# Patient Record
Sex: Male | Born: 1960 | Race: White | Hispanic: No | Marital: Single | Smoking: Former smoker
Health system: Southern US, Community
[De-identification: ages and names within clinical notes are randomized; demographics above are authoritative.]

## PROBLEM LIST (undated history)

## (undated) DIAGNOSIS — C801 Malignant (primary) neoplasm, unspecified: Secondary | ICD-10-CM

---

## 2003-11-17 ENCOUNTER — Encounter: Admission: RE | Admit: 2003-11-17 | Discharge: 2003-11-30 | Payer: Self-pay | Admitting: Occupational Medicine

## 2011-08-31 ENCOUNTER — Encounter: Payer: Self-pay | Admitting: *Deleted

## 2011-08-31 ENCOUNTER — Emergency Department (HOSPITAL_COMMUNITY)
Admission: EM | Admit: 2011-08-31 | Discharge: 2011-08-31 | Disposition: A | Discharge status: Home / Self Care | Payer: Self-pay | Attending: Emergency Medicine | Admitting: Emergency Medicine

## 2011-08-31 DIAGNOSIS — L03116 Cellulitis of left lower limb: Secondary | ICD-10-CM

## 2011-08-31 DIAGNOSIS — L02419 Cutaneous abscess of limb, unspecified: Secondary | ICD-10-CM | POA: Insufficient documentation

## 2011-08-31 DIAGNOSIS — M79609 Pain in unspecified limb: Secondary | ICD-10-CM | POA: Insufficient documentation

## 2011-08-31 DIAGNOSIS — Y92009 Unspecified place in unspecified non-institutional (private) residence as the place of occurrence of the external cause: Secondary | ICD-10-CM | POA: Insufficient documentation

## 2011-08-31 DIAGNOSIS — M7989 Other specified soft tissue disorders: Secondary | ICD-10-CM | POA: Insufficient documentation

## 2011-08-31 DIAGNOSIS — S90569A Insect bite (nonvenomous), unspecified ankle, initial encounter: Secondary | ICD-10-CM | POA: Insufficient documentation

## 2011-08-31 MED ORDER — DOXYCYCLINE HYCLATE 100 MG PO TABS
100.0000 mg | ORAL_TABLET | Freq: Once | ORAL | Status: AC
  Administered 2011-08-31: 100 mg via ORAL
  Filled 2011-08-31: qty 1

## 2011-08-31 MED ORDER — HYDROCODONE-ACETAMINOPHEN 5-325 MG PO TABS: 1.0000 | ORAL_TABLET | ORAL | Status: AC | PRN

## 2011-08-31 MED ORDER — HYDROCODONE-ACETAMINOPHEN 5-325 MG PO TABS
1.0000 | ORAL_TABLET | Freq: Once | ORAL | Status: AC
  Administered 2011-08-31: 1 via ORAL
  Filled 2011-08-31: qty 1

## 2011-08-31 MED ORDER — DOXYCYCLINE HYCLATE 100 MG PO CAPS: 100.0000 mg | ORAL_CAPSULE | Freq: Two times a day (BID) | ORAL | Status: AC

## 2011-08-31 NOTE — ED Notes (Signed)
Patient states that he saw a bite on his left knee yesterday and popped it and today is got bigger and very painful

## 2011-08-31 NOTE — ED Provider Notes (Signed)
History     CSN: [id number] Arrival date & time: 08/31/2011  6:02 PM   First MD Initiated Contact with Kenneth Chambers 08/31/11 1812      Chief Complaint  Kenneth Chambers presents with  . Insect Bite    (Consider location/radiation/quality/duration/timing/severity/associated sxs/prior treatment) HPI Comments: Kenneth Chambers noted a small pimple in his left distal anterior thigh 3 days ago.  He attempted to open it without significant success and since that time has had increased redness, swelling and pain at the site.  Minimal drainage at the site.  No fevers.  He's had one prior site of an abscess on the thigh previously.  He's tried ibuprofen at home for his symptoms but they have not helped his pain.  Kenneth Chambers is a 50 y.o. male presenting with leg pain. The history is provided by the Kenneth Chambers. No language interpreter was used.  Leg Pain  The incident occurred more than 2 days ago. The incident occurred at home. There was no injury mechanism. The pain is present in the left thigh. The quality of the pain is described as aching. The pain is moderate. The pain has been constant since onset. Pertinent negatives include no numbness, no inability to bear weight, no loss of motion, no muscle weakness, no loss of sensation and no tingling.    History reviewed. No pertinent past medical history.  History reviewed. No pertinent past surgical history.  History reviewed. No pertinent family history.  History  Substance Use Topics  . Smoking status: Former Smoker    Types: Cigarettes  . Smokeless tobacco: Not on file  . Alcohol Use: No      Review of Systems  Constitutional: Negative.  Negative for fever and chills.  HENT: Negative.   Eyes: Negative.  Negative for discharge and redness.  Respiratory: Negative.  Negative for cough and shortness of breath.   Cardiovascular: Negative.  Negative for chest pain.  Gastrointestinal: Negative.  Negative for nausea, vomiting and abdominal pain.  Genitourinary:  Negative.  Negative for hematuria.  Musculoskeletal: Negative.  Negative for back pain.  Skin: Positive for color change. Negative for rash.  Neurological: Negative for tingling, syncope, numbness and headaches.  Hematological: Negative.  Negative for adenopathy.  Psychiatric/Behavioral: Negative.  Negative for confusion.  All other systems reviewed and are negative.    Allergies  Review of Kenneth Chambers's allergies indicates no known allergies.  Home Medications  No current outpatient prescriptions on file.  BP 110/72  Pulse 94  Temp(Src) 97.4 F (36.3 C) (Oral)  Resp 16  SpO2 98%  Physical Exam  Constitutional: He is oriented to person, place, and time. He appears well-developed and well-nourished.  HENT:  Head: Normocephalic and atraumatic.  Eyes: Conjunctivae and EOM are normal. Pupils are equal, round, and reactive to light.  Neck: Normal range of motion. Neck supple.  Pulmonary/Chest: Effort normal.  Musculoskeletal: Normal range of motion.  Neurological: He is alert and oriented to person, place, and time.  Skin: Skin is warm and dry. No rash noted. There is erythema. No pallor.       Left distal anterior thigh has an area of approximately 3 inches in diameter of erythema and induration.  No fluctuance or crepitus noted.  There is a central 1 cm area of scab.  Psychiatric: He has a normal mood and affect. His behavior is normal. Judgment and thought content normal.    ED Course  Procedures (including critical care time)  Labs Reviewed - No data to display No results found.  No diagnosis found.    MDM  Kenneth Chambers presents with cellulitis to his left upper knee.  On ultrasound I visualized only minimal fluid pockets.  I did attempt a needle aspiration for incision and drainage without significant success for any pus returned.  Kenneth Chambers will be placed on doxycycline and Vicodin for pain control with followup with his primary care physician next week.  He understands to  return for worsening redness or swelling.        Nat Christen, MD 08/31/11 [phone]

## 2011-08-31 NOTE — ED Notes (Signed)
Secondary: lt. Upper, ant. Thigh insect bite. Onset: past Saturday. Reddened area ~4" x 4" diamter. Mild drainage, whitish/yellow.

## 2018-11-19 ENCOUNTER — Emergency Department (HOSPITAL_COMMUNITY)
Admission: EM | Admit: 2018-11-19 | Discharge: 2018-11-19 | Disposition: A | Discharge status: Home / Self Care | Payer: Medicaid Other | Attending: Emergency Medicine | Admitting: Emergency Medicine

## 2018-11-19 ENCOUNTER — Emergency Department (HOSPITAL_COMMUNITY): Payer: Medicaid Other

## 2018-11-19 ENCOUNTER — Encounter (HOSPITAL_COMMUNITY): Payer: Self-pay

## 2018-11-19 ENCOUNTER — Other Ambulatory Visit: Payer: Self-pay

## 2018-11-19 DIAGNOSIS — K9189 Other postprocedural complications and disorders of digestive system: Secondary | ICD-10-CM | POA: Diagnosis present

## 2018-11-19 DIAGNOSIS — Z87891 Personal history of nicotine dependence: Secondary | ICD-10-CM | POA: Insufficient documentation

## 2018-11-19 DIAGNOSIS — Z79899 Other long term (current) drug therapy: Secondary | ICD-10-CM | POA: Diagnosis not present

## 2018-11-19 HISTORY — DX: Malignant (primary) neoplasm, unspecified: C80.1

## 2018-11-19 LAB — COMPREHENSIVE METABOLIC PANEL
ALK PHOS: 201 U/L — AB (ref 38–126)
ALT: 30 U/L (ref 0–44)
ANION GAP: 7 (ref 5–15)
AST: 59 U/L — AB (ref 15–41)
Albumin: 2.3 g/dL — ABNORMAL LOW (ref 3.5–5.0)
BUN: 11 mg/dL (ref 6–20)
CO2: 25 mmol/L (ref 22–32)
Calcium: 8.2 mg/dL — ABNORMAL LOW (ref 8.9–10.3)
Chloride: 103 mmol/L (ref 98–111)
Creatinine, Ser: 0.49 mg/dL — ABNORMAL LOW (ref 0.61–1.24)
GFR calc Af Amer: >60 mL/min (ref >60)
Glucose, Bld: 109 mg/dL — ABNORMAL HIGH (ref 70–99)
POTASSIUM: 3.9 mmol/L (ref 3.5–5.1)
SODIUM: 135 mmol/L (ref 135–145)
TOTAL PROTEIN: 7.2 g/dL (ref 6.5–8.1)
Total Bilirubin: 7.4 mg/dL — ABNORMAL HIGH (ref 0.3–1.2)

## 2018-11-19 LAB — PROTIME-INR
INR: 1.42
PROTHROMBIN TIME: 17.1 s — AB (ref 11.4–15.2)

## 2018-11-19 LAB — CBC WITH DIFFERENTIAL/PLATELET
Abs Immature Granulocytes: 0.35 10*3/uL — ABNORMAL HIGH (ref 0.00–0.07)
Basophils Absolute: 0.1 10*3/uL (ref 0.0–0.1)
Basophils Relative: 1 %
Eosinophils Absolute: 0.5 10*3/uL (ref 0.0–0.5)
Eosinophils Relative: 3 %
HCT: 30.2 % — ABNORMAL LOW (ref 39.0–52.0)
HEMOGLOBIN: 9.4 g/dL — AB (ref 13.0–17.0)
IMMATURE GRANULOCYTES: 2 %
LYMPHS PCT: 10 %
Lymphs Abs: 1.9 10*3/uL (ref 0.7–4.0)
MCH: 31.3 pg (ref 26.0–34.0)
MCHC: 31.1 g/dL (ref 30.0–36.0)
MCV: 100.7 fL — ABNORMAL HIGH (ref 80.0–100.0)
MONO ABS: 1.9 10*3/uL — AB (ref 0.1–1.0)
MONOS PCT: 10 %
NEUTROS ABS: 14 10*3/uL — AB (ref 1.7–7.7)
NEUTROS PCT: 74 %
Platelets: 313 10*3/uL (ref 150–400)
RBC: 3 MIL/uL — AB (ref 4.22–5.81)
RDW: 17.5 % — ABNORMAL HIGH (ref 11.5–15.5)
WBC: 18.7 10*3/uL — AB (ref 4.0–10.5)
nRBC: 0 % (ref 0.0–0.2)

## 2018-11-19 LAB — LIPASE, BLOOD: LIPASE: 26 U/L (ref 11–51)

## 2018-11-19 LAB — I-STAT CREATININE, ED: Creatinine, Ser: 0.5 mg/dL — ABNORMAL LOW (ref 0.61–1.24)

## 2018-11-19 MED ORDER — IOPAMIDOL (ISOVUE-300) INJECTION 61%
100.0000 mL | Freq: Once | INTRAVENOUS | Status: AC | PRN
  Administered 2018-11-19: 100 mL via INTRAVENOUS

## 2018-11-19 MED ORDER — ONDANSETRON HCL 4 MG/2ML IJ SOLN
4.0000 mg | Freq: Once | INTRAMUSCULAR | Status: AC
Start: 2018-11-19 — End: 2018-11-19
  Administered 2018-11-19: 4 mg via INTRAVENOUS
  Filled 2018-11-19: qty 2

## 2018-11-19 MED ORDER — IOPAMIDOL (ISOVUE-300) INJECTION 61%
INTRAVENOUS | Status: AC
  Filled 2018-11-19: qty 100

## 2018-11-19 MED ORDER — SODIUM CHLORIDE (PF) 0.9 % IJ SOLN
INTRAMUSCULAR | Status: AC
  Filled 2018-11-19: qty 50

## 2018-11-19 MED ORDER — HYDROMORPHONE HCL 1 MG/ML IJ SOLN
1.0000 mg | Freq: Once | INTRAMUSCULAR | Status: AC
  Administered 2018-11-19: 1 mg via INTRAVENOUS
  Filled 2018-11-19: qty 1

## 2018-11-19 MED ORDER — MORPHINE SULFATE (PF) 4 MG/ML IV SOLN
4.0000 mg | Freq: Once | INTRAVENOUS | Status: AC
  Administered 2018-11-19: 4 mg via INTRAVENOUS
  Filled 2018-11-19: qty 1

## 2018-11-19 MED ORDER — SODIUM CHLORIDE 0.9 % IV BOLUS
1000.0000 mL | Freq: Once | INTRAVENOUS | Status: AC
  Administered 2018-11-19: 1000 mL via INTRAVENOUS

## 2018-11-19 NOTE — Discharge Instructions (Signed)
Follow up with IR.  Return for worsening issues.  Try the new dressing.

## 2018-11-19 NOTE — ED Triage Notes (Signed)
Pt reports that he had a stent placed in bile duct with a drain last Wednesday. Pt reports that it started leaking about a 1 1/2 days ago which is also causing severe pain.

## 2018-11-19 NOTE — ED Provider Notes (Signed)
[facility] DEPT Provider Note   CSN: [id number] Arrival date & time: 11/19/18  1641     History   Chief Complaint Chief Complaint  Patient presents with  . Post-op Problem  . Cancer Patient    HPI Kenneth Chambers is a 58 y.o. male.  58 yo M with a cc of draining from a biliary drain site. Patient seen recently at 45 with acute cholangitis s/p ERCP and biliary drain placement.   The history is provided by the patient.  Illness  Severity:  Mild Onset quality:  Sudden Duration:  2 days Timing:  Constant Progression:  Worsening Chronicity:  New Associated symptoms: abdominal pain   Associated symptoms: no chest pain, no congestion, no diarrhea, no fever, no headaches, no myalgias, no rash, no shortness of breath and no vomiting     Past Medical History:  Diagnosis Date  . Cancer ([place])     There are no active problems to display for this patient.   History reviewed. No pertinent surgical history.      Home Medications    Prior to Admission medications   Medication Sig Start Date End Date Taking? Authorizing Provider  amLODipine (NORVASC) 10 MG tablet Take 10 mg by mouth daily.   Yes [provider]  ciprofloxacin (CIPRO) 500 MG tablet Take 500 mg by mouth 2 (two) times daily.   Yes [provider]  finasteride (PROSCAR) 5 MG tablet Take 5 mg by mouth daily.   Yes [provider]  gabapentin (NEURONTIN) 300 MG capsule Take 300 mg by mouth 3 (three) times daily.   Yes [provider]  HYDROmorphone (DILAUDID) 4 MG tablet Take 4 mg by mouth every 3 (three) hours as needed for severe pain.   Yes [provider]  lactulose (CHRONULAC) 10 GM/15ML solution Take 20 g by mouth daily.   Yes [provider]  magnesium oxide (MAG-OX) 400 MG tablet Take 400 mg by mouth daily.   Yes [provider]  Melatonin 3 MG TABS Take 6 mg by mouth at bedtime as needed (sleep).   Yes [provider]  metoprolol succinate (TOPROL-XL) 25 MG 24 hr tablet Take 25 mg by mouth daily.   Yes [provider]  metroNIDAZOLE (FLAGYL) 500 MG tablet Take 500 mg by mouth 3 (three) times daily.   Yes [provider]  nystatin (NYSTATIN) powder Apply topically 2 (two) times daily. 1 application topically twice daily.   Yes [provider]  omeprazole (PRILOSEC) 40 MG capsule Take 40 mg by mouth daily.   Yes [provider]  oxycodone (ROXICODONE) 30 MG immediate release tablet Take 60 mg by mouth every 8 (eight) hours.   Yes [provider]  polyethylene glycol (MIRALAX / GLYCOLAX) packet Take 17 g by mouth daily.   Yes [provider]  prochlorperazine (COMPAZINE) 5 MG tablet Take 5 mg by mouth every 6 (six) hours as needed for nausea or vomiting.   Yes [provider]  sennosides-docusate sodium (SENOKOT-S) 8.6-50 MG tablet Take 2 tablets by mouth 2 (two) times daily.   Yes [provider]  sertraline (ZOLOFT) 100 MG tablet Take 200 mg by mouth daily.   Yes [provider]  spironolactone (ALDACTONE) 25 MG tablet Take 25 mg by mouth 2 (two) times daily.   Yes [provider]  VITAMIN D, CHOLECALCIFEROL, PO Take 800 Units by mouth daily.   Yes [provider]    Family History History  reviewed. No pertinent family history.  Social History Social History   Tobacco Use  . Smoking status: Former Smoker    Types: Cigarettes  Substance Use Topics  . Alcohol use: No  . Drug use: No     Allergies   Patient has no known allergies.   Review of Systems Review of Systems  Constitutional: Negative for chills and fever.  HENT: Negative for congestion and facial swelling.   Eyes: Negative for discharge and visual disturbance.  Respiratory: Negative for shortness of breath.   Cardiovascular: Negative for chest pain and palpitations.  Gastrointestinal: Positive for abdominal pain. Negative  for diarrhea and vomiting.  Musculoskeletal: Negative for arthralgias and myalgias.  Skin: Negative for color change and rash.  Neurological: Negative for tremors, syncope and headaches.  Psychiatric/Behavioral: Negative for confusion and dysphoric mood.     Physical Exam Updated Vital Signs BP 124/85   Pulse (!) 101   Temp 98.6 F (37 C) (Oral)   Resp 16   Ht 5\' 6"  (1.676 m)   Wt 63 kg   SpO2 95%   BMI 22.44 kg/m   Physical Exam Vitals signs and nursing note reviewed.  Constitutional:      Appearance: He is well-developed.     Comments: jaundiced  HENT:     Head: Normocephalic and atraumatic.  Eyes:     Pupils: Pupils are equal, round, and reactive to light.  Neck:     Musculoskeletal: Normal range of motion and neck supple.     Vascular: No JVD.  Cardiovascular:     Rate and Rhythm: Normal rate and regular rhythm.     Heart sounds: No murmur. No friction rub. No gallop.   Pulmonary:     Effort: No respiratory distress.     Breath sounds: No wheezing.  Abdominal:     General: There is no distension.     Tenderness: There is abdominal tenderness. There is no guarding or rebound.     Comments: Pain to the right upper quadrant with bilious drainage pain to the left upper quadrant with the same.  There is some mild leakage around the right upper quadrant catheter.  Site without erythema.  Drainage is bilious.  Musculoskeletal: Normal range of motion.  Skin:    Coloration: Skin is not pale.     Findings: No rash.  Neurological:     Mental Status: He is alert and oriented to person, place, and time.  Psychiatric:        Behavior: Behavior normal.      ED Treatments / Results  Labs (all labs ordered are listed, but only abnormal results are displayed) Labs Reviewed  CBC WITH DIFFERENTIAL/PLATELET - Abnormal; Notable for the following components:      Result Value   WBC 18.7 (*)    RBC 3.00 (*)    Hemoglobin 9.4 (*)    HCT 30.2 (*)    MCV 100.7 (*)    RDW  17.5 (*)    Neutro Abs 14.0 (*)    Monocytes Absolute 1.9 (*)    Abs Immature Granulocytes 0.35 (*)    All other components within normal limits  COMPREHENSIVE METABOLIC PANEL - Abnormal; Notable for the following components:   Glucose, Bld 109 (*)    Creatinine, Ser 0.49 (*)    Calcium 8.2 (*)    Albumin 2.3 (*)    AST 59 (*)    Alkaline Phosphatase 201 (*)    Total Bilirubin 7.4 (*)  All other components within normal limits  PROTIME-INR - Abnormal; Notable for the following components:   Prothrombin Time 17.1 (*)    All other components within normal limits  I-STAT CREATININE, ED - Abnormal; Notable for the following components:   Creatinine, Ser 0.50 (*)    All other components within normal limits  LIPASE, BLOOD    EKG None  Radiology Ct Abdomen Pelvis W Contrast  Result Date: 11/19/2018 CLINICAL DATA:  History of prior biliary stenting and internal external drainage catheters for approximately 1 week with leaking and abdominal pain, initial encounter EXAM: CT ABDOMEN AND PELVIS WITH CONTRAST TECHNIQUE: Multidetector CT imaging of the abdomen and pelvis was performed using the standard protocol following bolus administration of intravenous contrast. CONTRAST:  100 mL Isovue 300 COMPARISON:  None. FINDINGS: Lower chest: Lung bases demonstrate mild infiltrative change in the right lower lobe and left lower lobe atelectatic changes. No sizable effusion is seen. Hepatobiliary: The liver demonstrates both right and left-sided biliary tubes extending through the common bile duct and into the duodenum. The catheters appear appropriately placed although there are some dilated biliary ducts identified particularly in the lateral segment of the left lobe of the liver adjacent to a metallic stent as well as within the right lobe of the liver superiorly. Mottled density is noted within the central portion of the liver consistent with the patient's given clinical history of underlying  hepatocellular carcinoma. Metallic stents are also noted within the common bile duct and extending into the left biliary tree. Soft tissue density is noted within and the stents do not appear to extend to the level of the duodenum. The gallbladder appears of been surgically removed. Minimal perihepatic fluid is noted. Pancreas: Unremarkable. No pancreatic ductal dilatation or surrounding inflammatory changes. Spleen: Normal in size without focal abnormality. Adrenals/Urinary Tract: Adrenal glands are within normal limits bilaterally. Kidneys are well visualized bilaterally within normal enhancement pattern and normal excretion. The bladder is partially distended. Stomach/Bowel: Scattered fecal material is noted throughout the colon. No obstructive changes are seen. The appendix is not well visualized although no inflammatory changes are identified to suggest appendicitis. The small bowel and stomach appear within normal limits. Vascular/Lymphatic: No significant vascular calcifications are seen. Portacaval lymph node is noted which measures 13 mm in short axis. Some smaller nodes are noted surrounding the celiac axis. Some gastric and esophageal varices are seen. Attenuation of portal venous branches is noted centrally related to the patient's known hepatic mass. Reproductive: Prostate is unremarkable. Other: No abdominal wall hernia or abnormality. No abdominopelvic ascites. Musculoskeletal: No acute or significant osseous findings. IMPRESSION: Changes consistent with metallic biliary stenting and internal external drainage catheters bilaterally. Minimal perihepatic fluid is noted. This may be related to leakage around the right internal external drainage catheter. Soft tissue density is noted within the metallic stents likely related to tumor ingrowth. Central decreased attenuation with localized mass effect consistent with the known history of hepatocellular carcinoma Mild esophageal and gastric varices.  Prominent portacaval lymph node. Consolidation in the right lower lobe with atelectasis in the left lower lobe. No sizable effusion is seen. Electronically Signed   By: Inez Catalina M.D.   On: 11/19/2018 19:59    Procedures Procedures (including critical care time)  Medications Ordered in ED Medications  sodium chloride (PF) 0.9 % injection (has no administration in time range)  morphine 4 MG/ML injection 4 mg (4 mg Intravenous Given 11/19/18 1841)  ondansetron (ZOFRAN) injection 4 mg (4 mg Intravenous Given 11/19/18  1841)  sodium chloride 0.9 % bolus 1,000 mL (0 mLs Intravenous Stopped 11/19/18 2019)  iopamidol (ISOVUE-300) 61 % injection 100 mL (100 mLs Intravenous Contrast Given 11/19/18 1925)  HYDROmorphone (DILAUDID) injection 1 mg (1 mg Intravenous Given 11/19/18 2005)     Initial Impression / Assessment and Plan / ED Course  I have reviewed the triage vital signs and the nursing notes.  Pertinent labs & imaging results that were available during my care of the patient were reviewed by me and considered in my medical decision making (see chart for details).     58 yo M with a cc of drainage from the outside of a biliary tube. Having some pain to the RUQ.  Will obtain labs, CT abd pelvis.    CT scan shows biliary tubes in place, I discussed the case with Dr. Alferd Apa gastroenterology he recommended I discussed the case with IR.  Discussed case with IR physician who took down the patient's information and will call the patient in the morning for possible exchange.  I discussed with the family, they would like to try a different dressing.  We will try a Vaseline impregnated gauze at the site around the tube.  11:44 PM:  I have discussed the diagnosis/risks/treatment options with the patient and family and believe the pt to be eligible for discharge home to follow-up with PCP, GI, IR. We also discussed returning to the ED immediately if new or worsening sx occur. We discussed the sx  which are most concerning (e.g., sudden worsening pain, fever, inability to tolerate by mouth) that necessitate immediate return. Medications administered to the patient during their visit and any new prescriptions provided to the patient are listed below.  Medications given during this visit Medications  sodium chloride (PF) 0.9 % injection (has no administration in time range)  morphine 4 MG/ML injection 4 mg (4 mg Intravenous Given 11/19/18 1841)  ondansetron (ZOFRAN) injection 4 mg (4 mg Intravenous Given 11/19/18 1841)  sodium chloride 0.9 % bolus 1,000 mL (0 mLs Intravenous Stopped 11/19/18 2019)  iopamidol (ISOVUE-300) 61 % injection 100 mL (100 mLs Intravenous Contrast Given 11/19/18 1925)  HYDROmorphone (DILAUDID) injection 1 mg (1 mg Intravenous Given 11/19/18 2005)     The patient appears reasonably screen and/or stabilized for discharge and I doubt any other medical condition or other [facility] requiring further screening, evaluation, or treatment in the ED at this time prior to discharge.   Final Clinical Impressions(s) / ED Diagnoses   Final diagnoses:  Postoperative surgical complication involving digestive system associated with digestive system procedure, unspecified complication    ED Discharge Orders    None       Deno Etienne, DO 11/19/18 2344

## 2018-11-19 NOTE — ED Notes (Signed)
Bed: WA01 Expected date:  Expected time:  Means of arrival:  Comments: Hold for room triage 1

## 2018-11-20 ENCOUNTER — Ambulatory Visit (HOSPITAL_COMMUNITY)
Admission: RE | Admit: 2018-11-20 | Discharge: 2018-11-20 | Disposition: A | Discharge status: Home / Self Care | Payer: Medicaid Other | Source: Ambulatory Visit | Attending: Diagnostic Radiology | Admitting: Diagnostic Radiology

## 2018-11-20 ENCOUNTER — Other Ambulatory Visit (HOSPITAL_COMMUNITY): Payer: Self-pay | Admitting: Diagnostic Radiology

## 2018-11-20 ENCOUNTER — Encounter (HOSPITAL_COMMUNITY): Payer: Self-pay | Admitting: Interventional Radiology

## 2018-11-20 ENCOUNTER — Other Ambulatory Visit (HOSPITAL_COMMUNITY): Payer: Self-pay | Admitting: Interventional Radiology

## 2018-11-20 DIAGNOSIS — K8050 Calculus of bile duct without cholangitis or cholecystitis without obstruction: Secondary | ICD-10-CM

## 2018-11-20 DIAGNOSIS — K7689 Other specified diseases of liver: Secondary | ICD-10-CM

## 2018-11-20 DIAGNOSIS — Z4803 Encounter for change or removal of drains: Secondary | ICD-10-CM | POA: Diagnosis not present

## 2018-11-20 HISTORY — PX: IR EXCHANGE BILIARY DRAIN: IMG6046

## 2018-11-20 MED ORDER — LIDOCAINE HCL (PF) 1 % IJ SOLN
INTRAMUSCULAR | Status: AC | PRN
  Administered 2018-11-20: 5 mL

## 2018-11-20 MED ORDER — LIDOCAINE HCL 1 % IJ SOLN
INTRAMUSCULAR | Status: AC
  Administered 2018-11-20: 5 mL
  Filled 2018-11-20: qty 20

## 2018-11-20 MED ORDER — IOPAMIDOL (ISOVUE-300) INJECTION 61%
INTRAVENOUS | Status: AC
  Administered 2018-11-20: 15 mL
  Filled 2018-11-20: qty 50

## 2018-12-18 ENCOUNTER — Ambulatory Visit (HOSPITAL_COMMUNITY)
Admission: RE | Admit: 2018-12-18 | Discharge: 2018-12-18 | Disposition: A | Discharge status: Home / Self Care | Payer: Medicaid Other | Source: Ambulatory Visit | Attending: Interventional Radiology | Admitting: Interventional Radiology

## 2018-12-18 ENCOUNTER — Other Ambulatory Visit (HOSPITAL_COMMUNITY): Payer: Self-pay | Admitting: Interventional Radiology

## 2018-12-18 DIAGNOSIS — Z4803 Encounter for change or removal of drains: Secondary | ICD-10-CM | POA: Insufficient documentation

## 2018-12-18 DIAGNOSIS — K7689 Other specified diseases of liver: Secondary | ICD-10-CM | POA: Insufficient documentation

## 2018-12-18 DIAGNOSIS — K8050 Calculus of bile duct without cholangitis or cholecystitis without obstruction: Secondary | ICD-10-CM

## 2018-12-18 HISTORY — PX: IR EXCHANGE BILIARY DRAIN: IMG6046

## 2018-12-18 MED ORDER — IOPAMIDOL (ISOVUE-300) INJECTION 61%
INTRAVENOUS | Status: AC
  Administered 2018-12-18: 20 mL
  Filled 2018-12-18: qty 100

## 2018-12-18 MED ORDER — LIDOCAINE HCL 1 % IJ SOLN
INTRAMUSCULAR | Status: AC
  Filled 2018-12-18: qty 20

## 2018-12-18 MED ORDER — LIDOCAINE HCL 1 % IJ SOLN
INTRAMUSCULAR | Status: DC | PRN
  Administered 2018-12-18: 10 mL

## 2018-12-18 NOTE — Procedures (Signed)
Pre procedural Diagnosis: Biliary Obstruction Post procedural Diagnosis: Same  Successful fluroscopic exchange of bilateral transhepatic10 Fr biliary drainage catheter with ends coiled and locked within the duodenum.  Both biliary drains connected to gravity bags.  EBL: Trace  No immediate post procedural complications.  Ronny Bacon, MD Pager #: [phone]

## 2018-12-19 ENCOUNTER — Encounter (HOSPITAL_COMMUNITY): Payer: Self-pay | Admitting: Interventional Radiology

## 2019-01-02 ENCOUNTER — Other Ambulatory Visit (HOSPITAL_COMMUNITY): Payer: Medicaid Other

## 2019-01-08 DEATH — deceased

## 2019-02-17 ENCOUNTER — Inpatient Hospital Stay (HOSPITAL_COMMUNITY): Admission: RE | Admit: 2019-02-17 | Payer: Medicaid Other | Source: Ambulatory Visit

## 2021-03-07 IMAGING — XA IR EXCHANGE BILARY DRAIN
3 series · 9 of 9 positions shown · non-contrast
Comparison: COMPARISON
Fluoroscopic guided biliary drainage catheter exchange-11/20/2018

INDICATION: History malignancy, post failed placement of bilateral kissing
internal biliary stents subsequent undergoing placement of bilateral
internal/external percutaneous biliary drainage catheters at [facility].
Patient is now lives in [HOSPITAL] and is to undergo subsequent care
in this community.

Patient parents today for percutaneous biliary drainage catheter
injection and exchange given concern his right-sided biliary
drainage catheter has been inadvertently retracted.
EXAM:
FLUOROSCOPIC GUIDED BILATERAL PERCUTANEOUS BILIARY DRAINAGE
CATHETERS
TECHNIQUE: Informed written consent was obtained from the patient after a
discussion of the risks, benefits and alternatives to treatment.
Questions regarding the procedure were encouraged and answered. A
timeout was performed prior to the initiation of the procedure.

[Series 1: single · 1 of 1 slices shown]
[im 1/1]
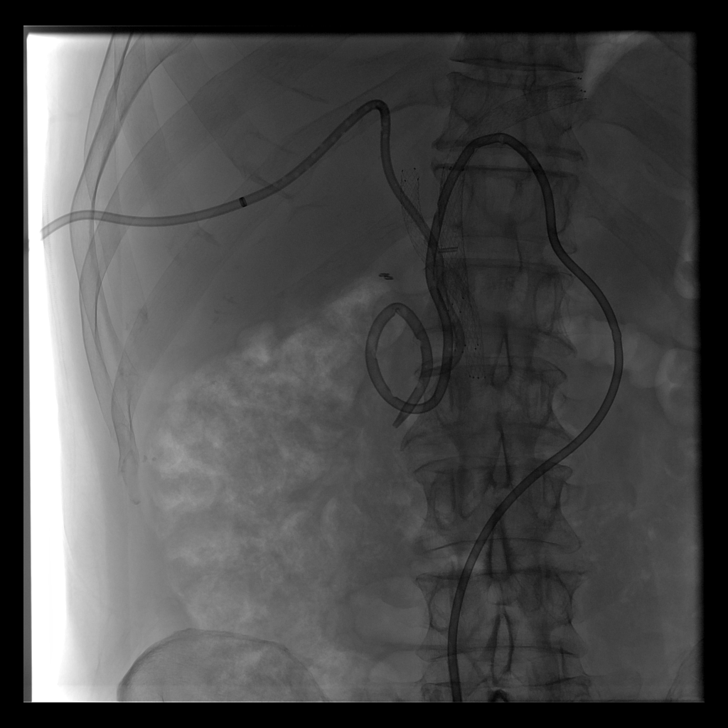

[Series 3: fl (-) angio · 4 of 40 frames shown]
[frame 7/40]
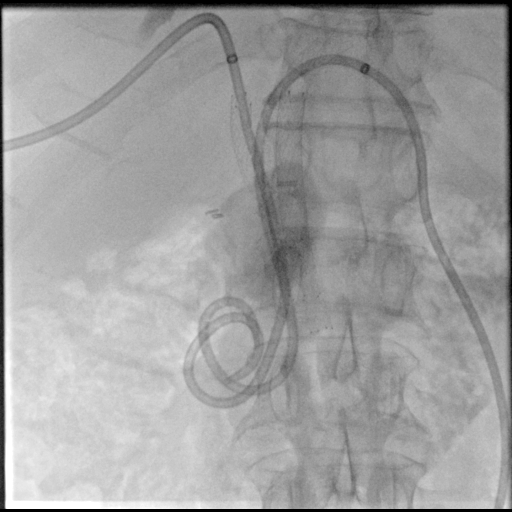
[frame 11/40]
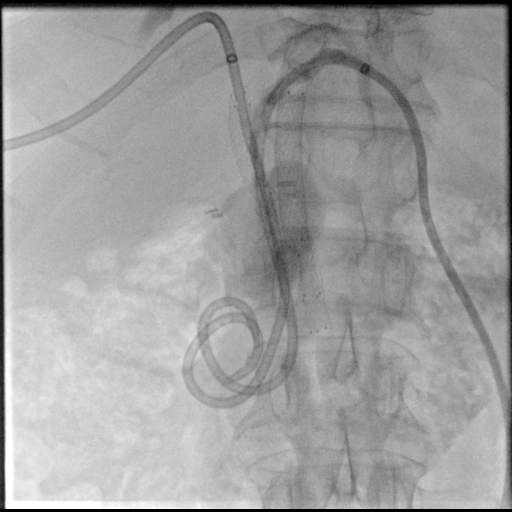
[frame 21/40]
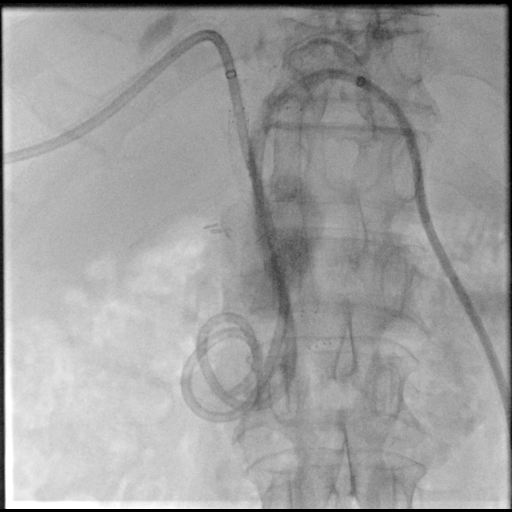
[frame 35/40]
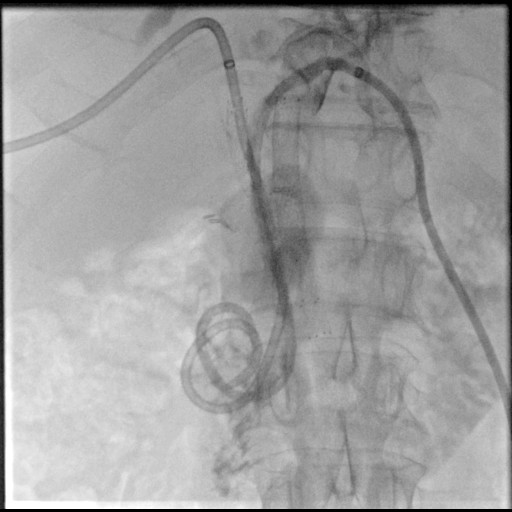

[Series 300: ir exchange biliary drain · 4 of 4 slices shown]
[im 1/4]
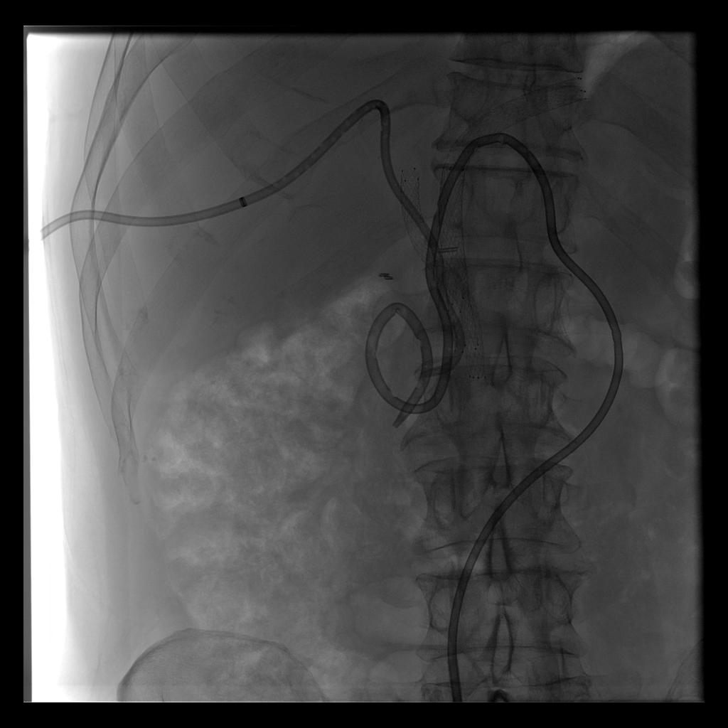
[im 2/4]
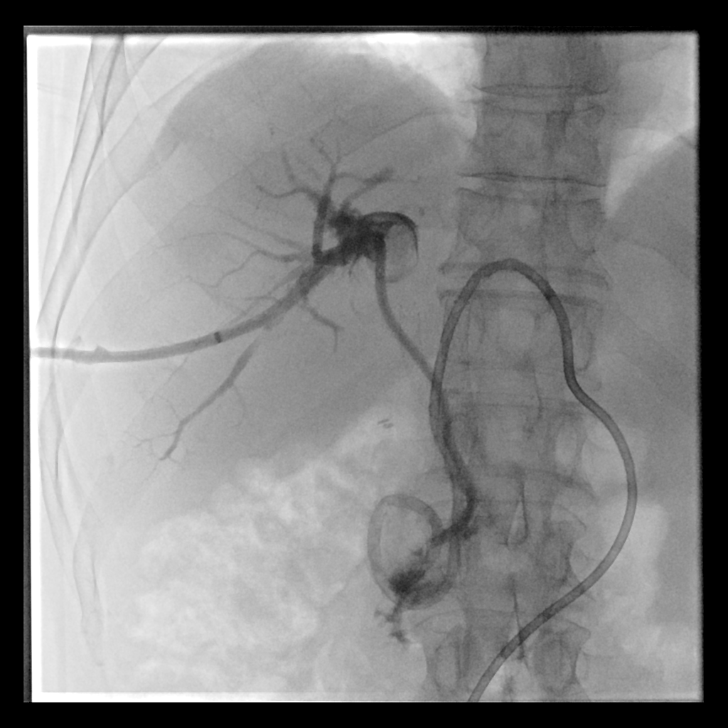
[im 3/4]
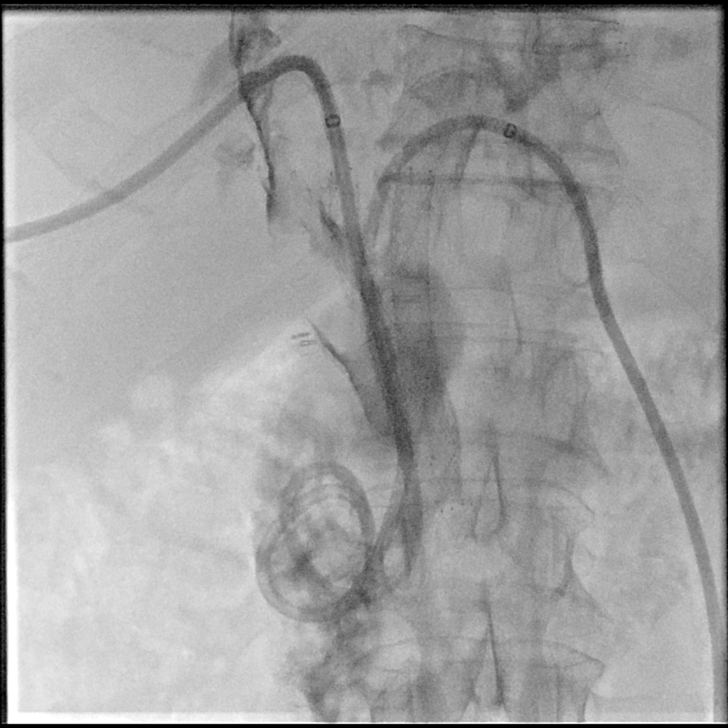
[im 4/4]
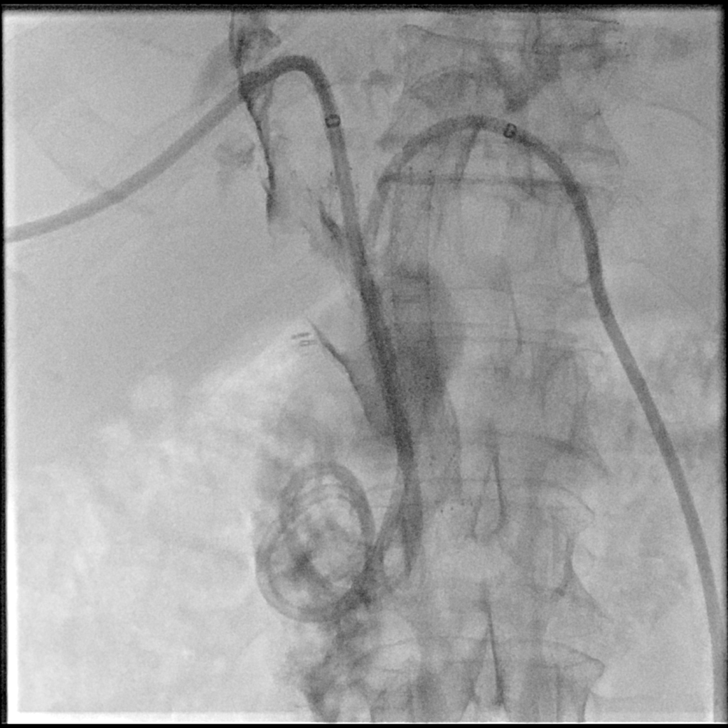

[9 of 9 positions shown; findings below may reference images not displayed]

CONTRAST:  20 cc Wsovue-OMM-administered into the biliary tree.

FLUOROSCOPY TIME:  8 minutes (59 mGy)

COMPLICATIONS:
None immediate.
The external portion of the existing bilateral percutaneous biliary
drainage catheters as well as the surrounding skin were prepped and
draped in the usual sterile fashion. A sterile drape was applied
covering the operative field. Maximum barrier sterile technique with
sterile gowns and gloves were used for the procedure. A timeout was
performed prior to the initiation of the procedure.

A pre procedural spot fluoroscopic image was obtained after contrast
was injected via the existing right-sided biliary drainage catheter.
The existing biliary drainage catheter was cut and cannulated with a
stiff Glidewire wire which was coiled within the proximal biliary
tree. Under intermittent fluoroscopic guidance, the existing biliary
drainage catheter was exchanged for a short Kumpe catheter which was
then advanced through the distal aspect of the CBD to the level of
the duodenum. Unfortunately, the inner catheter of the percutaneous
drainage catheter was unable to be retracted from the percutaneous
drainage drain ultimately requiring fluoroscopic guided exchange for
a new 10 French biliary drainage catheter with end coiled and locked
within the duodenum. Contrast injection confirmed appropriate
positioning.

Next, over a Amplatz wire, the Kumpe catheter was exchanged for a
new 10 French biliary drainage catheter with end ultimately coiled
and locked within the duodenum. Again, the inner catheter of the
percutaneous drainage catheter with end of the retracted from
percutaneous drain, ultimately requiring fluoroscopic guided
exchange of a new 10 French biliary drainage catheter with end
coiled and locked within the duodenum. Contrast injection confirmed
appropriate positioning and functionality of the biliary drainage
catheter.

Next, contrast injection was performed of the contralateral left
percutaneous biliary drainage catheter.

The external portion of the biliary drainage catheter was cut and
drainage catheter was cannulated with an Amplatz wire which was
coiled within the proximal small bowel. Under intermittent
fluoroscopic guidance, the existing biliary drainage catheter was
exchanged for a new 10 French percutaneous biliary drainage catheter
with end coiled and locked within the duodenum.

Contrast injection confirmed appropriate positioning and
functionality of the biliary drainage catheter

Both biliary drainage catheters were secured at the skin entrance
site within interrupted suture and StatLock devices. Both biliary
drainage catheters were reconnected to gravity bags. Dressings were
placed. The patient tolerated the above procedures well without
immediate postprocedural complication.
FINDINGS: The existing right-sided percutaneous drainage catheter has been
retracted to the level of the common bile duct.

After fluoroscopic guided exchange, the new right-sided 10 French
percutaneous drainage catheter is appropriately positioned with end
coiled and locked within the duodenum.

Existing left-sided percutaneous biliary drainage catheter is
appropriately positioned. After fluoroscopic guided exchange, the
new 10 French percutaneous drainage catheter is appropriately
positioned with end coiled and locked within the duodenum.

Redemonstrated abandoned bilateral internal metallic biliary stents.

Completion injections demonstrated appropriate position
functionality of the bilateral percutaneous drainage catheters
though a small amount of clot is seen within the biliary system,
likely the sequela of a malpositioned right-sided biliary drainage
catheter as well as the required additional exchanges due to initial
catheter malfunctioning.
IMPRESSION: Successful fluoroscopic guided exchange of bilateral 10 French
percutaneous biliary drainage catheters.

PLAN:
- As it appears these percutaneous biliary drainage catheters will
be chronic, would consider fluoroscopic guided up sizing to 12
French at the time of the patient's next scheduled exchange.

- Would consider a trial of biliary drainage catheter capping at the
time of the patient's next scheduled biliary drainage catheter
exchange. (Note, capping was not performed time today's examination
secondary to a small amount of blood within the biliary system
likely as a result of the malpositioned right-sided biliary drainage
catheter and today's exchange).
